# Patient Record
Sex: Male | Born: 1995 | Race: Asian | Hispanic: No | Marital: Single | Smoking: Never smoker
Health system: Southern US, Community
[De-identification: ages and names within clinical notes are randomized; demographics above are authoritative.]

---

## 2019-01-12 ENCOUNTER — Encounter (HOSPITAL_COMMUNITY): Payer: Self-pay

## 2019-01-12 ENCOUNTER — Ambulatory Visit (HOSPITAL_COMMUNITY)
Admission: EM | Admit: 2019-01-12 | Discharge: 2019-01-12 | Disposition: A | Discharge status: Home / Self Care | Payer: Self-pay | Attending: Urgent Care | Admitting: Urgent Care

## 2019-01-12 ENCOUNTER — Other Ambulatory Visit: Payer: Self-pay

## 2019-01-12 DIAGNOSIS — R140 Abdominal distension (gaseous): Secondary | ICD-10-CM

## 2019-01-12 DIAGNOSIS — R1084 Generalized abdominal pain: Secondary | ICD-10-CM

## 2019-01-12 MED ORDER — OMEPRAZOLE 20 MG PO CPDR: 20.0000 mg | DELAYED_RELEASE_CAPSULE | Freq: Every day | ORAL | 0 refills | Status: DC

## 2019-01-12 MED ORDER — FAMOTIDINE 20 MG PO TABS: 20.0000 mg | ORAL_TABLET | Freq: Two times a day (BID) | ORAL | 0 refills | Status: AC

## 2019-01-12 NOTE — ED Provider Notes (Signed)
  MRN: [id number] DOB: June 28, 1996  Subjective:   Kyle Huerta is a 23 y.o. male presenting for 3 day history of recurrent generalized mild-moderate abdominal pain/bloating. Pain is like a burning sensation. Has had 1 episode of vomiting. Sx are worse after eating. Has previously had trouble with gas and burping. Patient generally eats spicy foods but when he hurts like this has to stop eating spicy foods. Laying down also elicits his pain.  Patient has daily bowel movements without any bloody stools, straining or trouble with constipation.  He has no urinary symptoms.  Additional review of systems below.  Denies smoking cigarettes or drinking alcohol.  He is not currently taking any medications and has no known food or drug allergies.  Denies past medical and surgical history.  Review of Systems  Constitutional: Negative for fever and malaise/fatigue.  HENT: Negative for congestion, ear pain, sinus pain and sore throat.   Eyes: Negative for blurred vision, double vision, discharge and redness.  Respiratory: Negative for cough, hemoptysis, shortness of breath and wheezing.   Cardiovascular: Negative for chest pain.  Gastrointestinal: Positive for abdominal pain and vomiting. Negative for diarrhea and nausea.  Genitourinary: Negative for dysuria, flank pain and hematuria.  Musculoskeletal: Negative for myalgias.  Skin: Negative for rash.  Neurological: Negative for dizziness, weakness and headaches.  Psychiatric/Behavioral: Negative for depression and substance abuse.    Objective:   Vitals: BP 125/85 (BP Location: Right Arm)   Pulse 92   Temp 98.3 F (36.8 C) (Oral)   Resp 18   SpO2 96%   Physical Exam Constitutional:      Appearance: Normal appearance. He is well-developed and normal weight.  HENT:     Head: Normocephalic and atraumatic.     Right Ear: External ear normal.     Left Ear: External ear normal.     Nose: Nose normal.     Mouth/Throat:     Pharynx: Oropharynx is  clear.  Eyes:     General: No scleral icterus.    Extraocular Movements: Extraocular movements intact.     Pupils: Pupils are equal, round, and reactive to light.  Cardiovascular:     Rate and Rhythm: Normal rate and regular rhythm.     Heart sounds: No murmur. No friction rub. No gallop.   Pulmonary:     Effort: Pulmonary effort is normal. No respiratory distress.     Breath sounds: No wheezing or rales.  Abdominal:     General: Bowel sounds are normal. There is no distension.     Palpations: Abdomen is soft. There is no mass.     Tenderness: There is abdominal tenderness (Mild over mid abdomen). There is no guarding or rebound.  Skin:    General: Skin is warm and dry.  Neurological:     Mental Status: He is alert and oriented to person, place, and time.  Psychiatric:        Mood and Affect: Mood normal.        Behavior: Behavior normal.    Assessment and Plan :   1. Generalized abdominal pain   2. Abdominal bloating    Will manage for GERD with Pepcid and Prilosec.  Counseled patient on dietary modifications.  Generally his physical exam is really good and vital signs are stable.  Will place patient in a work you to establish care with a PCP for continued work-up including imaging and labs.   Jaynee Eagles, [place] 01/12/19 [phone]

## 2019-01-12 NOTE — ED Triage Notes (Signed)
Pt presents with generalized abdominal pain & burning that he states mostly happens after eating and at bedtime; pt also complains of back pain.

## 2019-01-15 ENCOUNTER — Emergency Department (HOSPITAL_COMMUNITY)
Admission: EM | Admit: 2019-01-15 | Discharge: 2019-01-15 | Disposition: A | Discharge status: Home / Self Care | Payer: Self-pay | Attending: Emergency Medicine | Admitting: Emergency Medicine

## 2019-01-15 ENCOUNTER — Other Ambulatory Visit: Payer: Self-pay

## 2019-01-15 ENCOUNTER — Emergency Department (HOSPITAL_COMMUNITY): Payer: Self-pay

## 2019-01-15 DIAGNOSIS — R1033 Periumbilical pain: Secondary | ICD-10-CM

## 2019-01-15 DIAGNOSIS — R1110 Vomiting, unspecified: Secondary | ICD-10-CM | POA: Insufficient documentation

## 2019-01-15 DIAGNOSIS — R1011 Right upper quadrant pain: Secondary | ICD-10-CM

## 2019-01-15 LAB — CBC WITH DIFFERENTIAL/PLATELET
Abs Immature Granulocytes: 0.04 10*3/uL (ref 0.00–0.07)
Basophils Absolute: 0.1 10*3/uL (ref 0.0–0.1)
Basophils Relative: 1 %
Eosinophils Absolute: 1.8 10*3/uL — ABNORMAL HIGH (ref 0.0–0.5)
Eosinophils Relative: 14 %
HCT: 42.8 % (ref 39.0–52.0)
Hemoglobin: 15.2 g/dL (ref 13.0–17.0)
Immature Granulocytes: 0 %
Lymphocytes Relative: 35 %
Lymphs Abs: 4.6 10*3/uL — ABNORMAL HIGH (ref 0.7–4.0)
MCH: 29.1 pg (ref 26.0–34.0)
MCHC: 35.5 g/dL (ref 30.0–36.0)
MCV: 81.8 fL (ref 80.0–100.0)
Monocytes Absolute: 0.6 10*3/uL (ref 0.1–1.0)
Monocytes Relative: 5 %
Neutro Abs: 5.9 10*3/uL (ref 1.7–7.7)
Neutrophils Relative %: 45 %
Platelets: 200 10*3/uL (ref 150–400)
RBC: 5.23 MIL/uL (ref 4.22–5.81)
RDW: 12.6 % (ref 11.5–15.5)
WBC: 13.1 10*3/uL — ABNORMAL HIGH (ref 4.0–10.5)
nRBC: 0 % (ref 0.0–0.2)

## 2019-01-15 LAB — COMPREHENSIVE METABOLIC PANEL
ALT: 156 U/L — ABNORMAL HIGH (ref 0–44)
AST: 76 U/L — ABNORMAL HIGH (ref 15–41)
Albumin: 4.3 g/dL (ref 3.5–5.0)
Alkaline Phosphatase: 88 U/L (ref 38–126)
Anion gap: 8 (ref 5–15)
BUN: 6 mg/dL (ref 6–20)
CO2: 24 mmol/L (ref 22–32)
Calcium: 9.3 mg/dL (ref 8.9–10.3)
Chloride: 105 mmol/L (ref 98–111)
Creatinine, Ser: 0.91 mg/dL (ref 0.61–1.24)
GFR calc Af Amer: >60 mL/min (ref >60)
GFR calc non Af Amer: >60 mL/min (ref >60)
Glucose, Bld: 102 mg/dL — ABNORMAL HIGH (ref 70–99)
Potassium: 3.8 mmol/L (ref 3.5–5.1)
Sodium: 137 mmol/L (ref 135–145)
Total Bilirubin: 1.1 mg/dL (ref 0.3–1.2)
Total Protein: 7.1 g/dL (ref 6.5–8.1)

## 2019-01-15 LAB — URINALYSIS, ROUTINE W REFLEX MICROSCOPIC
Bilirubin Urine: NEGATIVE
Glucose, UA: NEGATIVE mg/dL
Hgb urine dipstick: NEGATIVE
Ketones, ur: NEGATIVE mg/dL
Leukocytes,Ua: NEGATIVE
Nitrite: NEGATIVE
Protein, ur: NEGATIVE mg/dL
Specific Gravity, Urine: 1.02 (ref 1.005–1.030)
pH: 6 (ref 5.0–8.0)

## 2019-01-15 LAB — LIPASE, BLOOD: Lipase: 24 U/L (ref 11–51)

## 2019-01-15 LAB — ACETAMINOPHEN LEVEL: Acetaminophen (Tylenol), Serum: <10 ug/mL — ABNORMAL LOW (ref 10–30)

## 2019-01-15 MED ORDER — ONDANSETRON 4 MG PO TBDP
8.0000 mg | ORAL_TABLET | Freq: Once | ORAL | Status: AC
  Administered 2019-01-15: 09:00:00 8 mg via ORAL
  Filled 2019-01-15: qty 2

## 2019-01-15 MED ORDER — PANTOPRAZOLE SODIUM 40 MG PO TBEC: 40.0000 mg | DELAYED_RELEASE_TABLET | Freq: Every day | ORAL | 0 refills | Status: AC

## 2019-01-15 MED ORDER — ALUM & MAG HYDROXIDE-SIMETH 200-200-20 MG/5ML PO SUSP
30.0000 mL | Freq: Once | ORAL | Status: AC
  Administered 2019-01-15: 30 mL via ORAL
  Filled 2019-01-15: qty 30

## 2019-01-15 MED ORDER — PANTOPRAZOLE SODIUM 40 MG IV SOLR
40.0000 mg | Freq: Once | INTRAVENOUS | Status: AC
  Administered 2019-01-15: 40 mg via INTRAVENOUS
  Filled 2019-01-15: qty 40

## 2019-01-15 NOTE — ED Triage Notes (Signed)
Pt here for central abdominal burning. Seen for same on Thursday, has been taking rx with minimal relief. Endorses vomiting after eating.

## 2019-01-15 NOTE — ED Notes (Signed)
Patient transported to Ultrasound 

## 2019-01-15 NOTE — ED Provider Notes (Signed)
[facility] Provider Note   CSN: [id number] Arrival date & time: 01/15/19  [id number]     History   Chief Complaint Chief Complaint  Patient presents with  . Abdominal Pain    HPI Janifer Adieavin Galasso is a 23 y.o. male who presents with abdominal pain.  No significant past medical history.  Patient states that since Tuesday he has had constant periumbilical abdominal pain.  Pain feels like a burning sensation.  He states that whenever he tries to eat something - even water -he will vomit and the pain will worsen.  The pain also seems to be better in the morning and worse at night.  He went to urgent care and symptoms sounded consistent with gastritis/reflux and he was given omeprazole and Pepcid.  He states that this does help his pain for several hours however the pain becomes unbearable at night.  He denies fever, chills, chest pain, nausea, diarrhea or constipation.  He has not had any blood in the stool or vomit.  He has had similar symptoms like this before several months ago but it went away after he drinks warm salt water.  This episode is much worse.  He denies taking significant amount of NSAIDs or Tylenol.  He says he does not drink any alcohol.  He denies any urinary symptoms.  Denies prior abdominal surgeries. No recent travel - he states he's been in the country [place] 8 years.     HPI  No past medical history on file.  There are no active problems to display for this patient.   No past surgical history on file.      Home Medications    Prior to Admission medications   Medication Sig Start Date End Date Taking? Authorizing Provider  famotidine (PEPCID) 20 MG tablet Take 1 tablet (20 mg total) by mouth 2 (two) times daily. 01/12/19   Wallis BambergMani, Mario, PA-C  omeprazole (PRILOSEC) 20 MG capsule Take 1 capsule (20 mg total) by mouth daily. 01/12/19   Wallis BambergMani, Mario, PA-C    Family History Family History  Family history unknown: Yes    Social History  Social History   Tobacco Use  . Smoking status: Never Smoker  . Smokeless tobacco: Never Used  Substance Use Topics  . Alcohol use: Not on file  . Drug use: Not on file     Allergies   Patient has no known allergies.   Review of Systems Review of Systems  Constitutional: Negative for fever.  Cardiovascular: Negative for chest pain.  Gastrointestinal: Positive for abdominal pain and vomiting. Negative for blood in stool, constipation, diarrhea and nausea.  Genitourinary: Negative for difficulty urinating and dysuria.  All other systems reviewed and are negative.    Physical Exam Updated Vital Signs BP 132/86 (BP Location: Right Arm)   Pulse 62   Temp 98 F (36.7 C) (Oral)   Resp 16   Ht 5\' 4"  (1.626 m)   Wt 68 kg   SpO2 99%   BMI 25.75 kg/m   Physical Exam Vitals signs and nursing note reviewed.  Constitutional:      General: He is not in acute distress.    Appearance: Normal appearance. He is well-developed. He is not ill-appearing.     Comments: Calm, cooperative. Comfortable appearing. Speaks Nepali  HENT:     Head: Normocephalic and atraumatic.  Eyes:     General: No scleral icterus.       Right eye: No discharge.  Left eye: No discharge.     Conjunctiva/sclera: Conjunctivae normal.     Pupils: Pupils are equal, round, and reactive to light.  Neck:     Musculoskeletal: Normal range of motion.  Cardiovascular:     Rate and Rhythm: Normal rate and regular rhythm.  Pulmonary:     Effort: Pulmonary effort is normal. No respiratory distress.     Breath sounds: Normal breath sounds. No stridor.  Abdominal:     General: Abdomen is flat. There is no distension.     Palpations: Abdomen is soft.     Tenderness: There is abdominal tenderness (mild periumbilical).  Skin:    General: Skin is warm and dry.  Neurological:     Mental Status: He is alert and oriented to person, place, and time.  Psychiatric:        Mood and Affect: Mood normal.         Behavior: Behavior normal.      ED Treatments / Results  Labs (all labs ordered are listed, but only abnormal results are displayed) Labs Reviewed  COMPREHENSIVE METABOLIC PANEL - Abnormal; Notable for the following components:      Result Value   Glucose, Bld 102 (*)    AST 76 (*)    ALT 156 (*)    All other components within normal limits  CBC WITH DIFFERENTIAL/PLATELET - Abnormal; Notable for the following components:   WBC 13.1 (*)    Lymphs Abs 4.6 (*)    Eosinophils Absolute 1.8 (*)    All other components within normal limits  ACETAMINOPHEN LEVEL - Abnormal; Notable for the following components:   Acetaminophen (Tylenol), Serum <10 (*)    All other components within normal limits  LIPASE, BLOOD  URINALYSIS, ROUTINE W REFLEX MICROSCOPIC  HEPATITIS PANEL, ACUTE    EKG None  Radiology No results found.  Procedures Procedures (including critical care time)  Medications Ordered in ED Medications - No data to display   Initial Impression / Assessment and Plan / ED Course  I have reviewed the triage vital signs and the nursing notes.  Pertinent labs & imaging results that were available during my care of the patient were reviewed by me and considered in my medical decision making (see chart for details).  23 year old male presents with periumbilical abdominal pain for several days it is a burning sensation.  Is worse after eating.  He is taking omeprazole and Pepcid and it does make his symptoms better temporarily but he is worried because symptoms are not going away.  His vitals are normal.  Heart is regular rate and rhythm, lungs are clear to auscultation, abdomen is soft and minimally tender in the periumbilical area.  Will obtain labs and give medication for gastritis/reflux. I do not think he needs imaging at this time.   10:25 AM CBC is remarkable for leukocytosis of 13.1 with mild elevation of lymphocytes and eosinophils. CMP is remarkable for mild elevation of  LFTs. AST is 76 and ALT is 156. AP and bili are normal. UA is normal. Will order RUQ [place] to further assess. DDX biliary pathology vs hepatitis.   Ultrasound is remarkable for possible fatty liver disease. Unclear if this is related to his elevated liver enzymes or not.  On recheck the patient he states that the pain is somewhat better after medication.  He tolerated p.o. here.  Tylenol levels are normal.  I advised him to follow-up with GI.  Will change his PPI to Protonix and advised to  continue Pepcid.  Return precautions given.   Final Clinical Impressions(s) / ED Diagnoses   Final diagnoses:  RUQ pain  Periumbilical abdominal pain    ED Discharge Orders    None       Bethel BornGekas, Livan Hires Marie, PA-C 01/15/19 1228    Arby BarrettePfeiffer, Marcy, MD 01/25/19 1231

## 2019-01-15 NOTE — ED Notes (Signed)
Used language iPad to speak and discharge patient.

## 2019-01-15 NOTE — ED Notes (Signed)
Patient transported back from Ultrasound 

## 2019-01-15 NOTE — Discharge Instructions (Addendum)
Please follow up with Gastroenterology (stomach doctor) Take Protonix 40mg  every day Continue Famotidine 20mg  twice a day Avoid spicy foods, fatty foods, and NSAIDs (Ibuprofen, Advil, Aspirin, BC powder, Naproxen, Aleve, etc) Return if worsening

## 2019-01-16 LAB — HEPATITIS PANEL, ACUTE
HCV Ab: <0.1 s/co ratio (ref 0.0–0.9)
Hep A IgM: NEGATIVE
Hep B C IgM: NEGATIVE
Hepatitis B Surface Ag: NEGATIVE

## 2019-01-17 ENCOUNTER — Encounter (HOSPITAL_COMMUNITY): Payer: Self-pay

## 2019-01-17 ENCOUNTER — Emergency Department (HOSPITAL_COMMUNITY): Payer: Self-pay

## 2019-01-17 ENCOUNTER — Other Ambulatory Visit: Payer: Self-pay

## 2019-01-17 ENCOUNTER — Emergency Department (HOSPITAL_COMMUNITY)
Admission: EM | Admit: 2019-01-17 | Discharge: 2019-01-17 | Disposition: A | Discharge status: Home / Self Care | Payer: Self-pay | Attending: Emergency Medicine | Admitting: Emergency Medicine

## 2019-01-17 DIAGNOSIS — R109 Unspecified abdominal pain: Secondary | ICD-10-CM | POA: Insufficient documentation

## 2019-01-17 DIAGNOSIS — Z79899 Other long term (current) drug therapy: Secondary | ICD-10-CM | POA: Insufficient documentation

## 2019-01-17 LAB — COMPREHENSIVE METABOLIC PANEL
ALT: 141 U/L — ABNORMAL HIGH (ref 0–44)
AST: 74 U/L — ABNORMAL HIGH (ref 15–41)
Albumin: 4.2 g/dL (ref 3.5–5.0)
Alkaline Phosphatase: 84 U/L (ref 38–126)
Anion gap: 11 (ref 5–15)
BUN: 8 mg/dL (ref 6–20)
CO2: 22 mmol/L (ref 22–32)
Calcium: 9.2 mg/dL (ref 8.9–10.3)
Chloride: 107 mmol/L (ref 98–111)
Creatinine, Ser: 0.91 mg/dL (ref 0.61–1.24)
GFR calc Af Amer: >60 mL/min (ref >60)
GFR calc non Af Amer: >60 mL/min (ref >60)
Glucose, Bld: 151 mg/dL — ABNORMAL HIGH (ref 70–99)
Potassium: 3.8 mmol/L (ref 3.5–5.1)
Sodium: 140 mmol/L (ref 135–145)
Total Bilirubin: 0.8 mg/dL (ref 0.3–1.2)
Total Protein: 6.9 g/dL (ref 6.5–8.1)

## 2019-01-17 LAB — URINALYSIS, ROUTINE W REFLEX MICROSCOPIC
Bilirubin Urine: NEGATIVE
Glucose, UA: NEGATIVE mg/dL
Hgb urine dipstick: NEGATIVE
Ketones, ur: NEGATIVE mg/dL
Leukocytes,Ua: NEGATIVE
Nitrite: NEGATIVE
Protein, ur: NEGATIVE mg/dL
Specific Gravity, Urine: 1.019 (ref 1.005–1.030)
pH: 7 (ref 5.0–8.0)

## 2019-01-17 LAB — CBC
HCT: 42.5 % (ref 39.0–52.0)
Hemoglobin: 14.7 g/dL (ref 13.0–17.0)
MCH: 28.7 pg (ref 26.0–34.0)
MCHC: 34.6 g/dL (ref 30.0–36.0)
MCV: 83 fL (ref 80.0–100.0)
Platelets: 220 10*3/uL (ref 150–400)
RBC: 5.12 MIL/uL (ref 4.22–5.81)
RDW: 12.6 % (ref 11.5–15.5)
WBC: 13.9 10*3/uL — ABNORMAL HIGH (ref 4.0–10.5)
nRBC: 0 % (ref 0.0–0.2)

## 2019-01-17 LAB — LIPASE, BLOOD: Lipase: 27 U/L (ref 11–51)

## 2019-01-17 MED ORDER — SODIUM CHLORIDE 0.9% FLUSH: 3.0000 mL | Freq: Once | INTRAVENOUS | Status: DC

## 2019-01-17 MED ORDER — IOHEXOL 300 MG/ML  SOLN
100.0000 mL | Freq: Once | INTRAMUSCULAR | Status: AC | PRN
  Administered 2019-01-17: 16:00:00 100 mL via INTRAVENOUS

## 2019-01-17 MED ORDER — SUCRALFATE 1 G PO TABS: 1.0000 g | ORAL_TABLET | Freq: Three times a day (TID) | ORAL | 0 refills | Status: AC

## 2019-01-17 NOTE — ED Triage Notes (Signed)
Pt reports continued abd pain for the past week, pt seen at [facility] and was told it was GERD, pt then came here on Sunday and had an US done with no acute findings. Pt rates pain 9/10 and states it sometimes radiates to his back.  nad noted at this time.

## 2019-01-17 NOTE — ED Provider Notes (Addendum)
[facility] EMERGENCY DEPARTMENT Provider Note   CSN: [id number] Arrival date & time: 01/17/19  1253    History   Chief Complaint Chief Complaint  Patient presents with  . Abdominal Pain    HPI Kyle Huerta is a 23 y.o. male.     HPI Pt states he has been having pain in his abdomen for 1 week.  The pt has been seen a couple of times for this now.  He has been given medications without much relief.  THe pain is in the mid abdomen and goes to his back.   No fever.  He has been vomiting 4-5 times since the pain started.  He has not vomited today.  It gets worse when he eats. Sometimes the pain feels like a burning but not always.  He was referred to [facility] but the firest appointment is not until august.  The pain was worse so he came back to the ED.  He was seen in the ED two days ago for this same complaint. History reviewed. No pertinent past medical history.  There are no active problems to display for this patient.   History reviewed. No pertinent surgical history.      Home Medications    Prior to Admission medications   Medication Sig Start Date End Date Taking? Authorizing Provider  famotidine (PEPCID) 20 MG tablet Take 1 tablet (20 mg total) by mouth 2 (two) times daily. 01/12/19  Yes Jaynee Eagles, PA-C  pantoprazole (PROTONIX) 40 MG tablet Take 1 tablet (40 mg total) by mouth daily. 01/15/19  Yes Recardo Evangelist, PA-C  sucralfate (CARAFATE) 1 g tablet Take 1 tablet (1 g total) by mouth 4 (four) times daily -  with meals and at bedtime. 01/17/19   Dorie Rank, MD  omeprazole (PRILOSEC) 20 MG capsule Take 1 capsule (20 mg total) by mouth daily. Patient not taking: Reported on 01/15/2019 01/12/19 01/15/19  Jaynee Eagles, PA-C    Family History Family History  Family history unknown: Yes    Social History Social History   Tobacco Use  . Smoking status: Never Smoker  . Smokeless tobacco: Never Used  Substance Use Topics  . Alcohol use: Not on file   . Drug use: Not on file     Allergies   Patient has no known allergies.   Review of Systems Review of Systems   Physical Exam Updated Vital Signs BP 136/85 (BP Location: Right Arm)   Pulse 79   Temp 98.4 F (36.9 C) (Oral)   Resp 16   Ht 1.626 m (5\' 4" )   Wt 68 kg   SpO2 97%   BMI 25.75 kg/m   Physical Exam Vitals signs and nursing note reviewed.  Constitutional:      General: He is not in acute distress.    Appearance: He is well-developed.  HENT:     Head: Normocephalic and atraumatic.     Right Ear: External ear normal.     Left Ear: External ear normal.  Eyes:     General: No scleral icterus.       Right eye: No discharge.        Left eye: No discharge.     Conjunctiva/sclera: Conjunctivae normal.  Neck:     Musculoskeletal: Neck supple.     Trachea: No tracheal deviation.  Cardiovascular:     Rate and Rhythm: Normal rate and regular rhythm.  Pulmonary:     Effort: Pulmonary effort is normal. No respiratory  distress.     Breath sounds: Normal breath sounds. No stridor. No wheezing or rales.  Abdominal:     General: Bowel sounds are normal. There is no distension.     Palpations: Abdomen is soft.     Tenderness: There is generalized abdominal tenderness. There is no guarding or rebound.  Musculoskeletal:        General: No tenderness.  Skin:    General: Skin is warm and dry.     Findings: No rash.  Neurological:     Mental Status: He is alert.     Cranial Nerves: No cranial nerve deficit (no facial droop, extraocular movements intact, no slurred speech).     Sensory: No sensory deficit.     Motor: No abnormal muscle tone or seizure activity.     Coordination: Coordination normal.      ED Treatments / Results  Labs (all labs ordered are listed, but only abnormal results are displayed) Labs Reviewed  COMPREHENSIVE METABOLIC PANEL - Abnormal; Notable for the following components:      Result Value   Glucose, Bld 151 (*)    AST 74 (*)    ALT  141 (*)    All other components within normal limits  CBC - Abnormal; Notable for the following components:   WBC 13.9 (*)    All other components within normal limits  LIPASE, BLOOD  URINALYSIS, ROUTINE W REFLEX MICROSCOPIC    EKG None  Radiology Ct Abdomen Pelvis W Contrast  Result Date: 01/17/2019 CLINICAL DATA:  Lower abdomen pain for 1 week. EXAM: CT ABDOMEN AND PELVIS WITH CONTRAST TECHNIQUE: Multidetector CT imaging of the abdomen and pelvis was performed using the standard protocol following bolus administration of intravenous contrast. CONTRAST:  100mL OMNIPAQUE IOHEXOL 300 MG/ML  SOLN COMPARISON:  Right upper quadrant ultrasound January 15, 2019 FINDINGS: Lower chest: No acute abnormality. Hepatobiliary: Diffuse low density of the liver is identified without focal liver lesion. The gallbladder is normal. The biliary tree is normal. Pancreas: Unremarkable. No pancreatic ductal dilatation or surrounding inflammatory changes. Spleen: Normal in size without focal abnormality. Adrenals/Urinary Tract: Adrenal glands are unremarkable. Kidneys are normal, without renal calculi, focal lesion, or hydronephrosis. Bladder is unremarkable. Stomach/Bowel: Stomach is within normal limits. The appendix is normal. No evidence of bowel wall thickening, distention, or inflammatory changes. Vascular/Lymphatic: No significant vascular findings are present. No enlarged abdominal or pelvic lymph nodes. Reproductive: Prostate is unremarkable. Other: None Musculoskeletal: No acute abnormality IMPRESSION: No acute abnormality identified in the abdomen and pelvis. No bowel obstruction is noted. Normal appendix. Fatty infiltration of liver. Electronically Signed   By: Sherian ReinWei-Chen  Lin M.D.   On: 01/17/2019 16:00    Procedures Procedures (including critical care time)  Medications Ordered in ED Medications  sodium chloride flush (NS) 0.9 % injection 3 mL (has no administration in time range)  iohexol (OMNIPAQUE) 300  MG/ML solution 100 mL (100 mLs Intravenous Contrast Given 01/17/19 1547)     Initial Impression / Assessment and Plan / ED Course  I have reviewed the triage vital signs and the nursing notes.  Pertinent labs & imaging results that were available during my care of the patient were reviewed by me and considered in my medical decision making (see chart for details).   Patient return to the ED for worsening abdominal pain.  Patient's laboratory tests show persistent elevation of LFTs these are only mildly elevated.  He had a hepatitis panel the other day and that is negative.  The  CT scan today does not show any abnormalities to account for his symptoms.  They be having issues with an ulcer or gastritis.  Patient is already taking antacids.  I will have him add on Carafate.  He appears stable for outpatient follow-up with gastroenterology as planned.  Final Clinical Impressions(s) / ED Diagnoses   Final diagnoses:  Abdominal pain, unspecified abdominal location    ED Discharge Orders         Ordered    sucralfate (CARAFATE) 1 g tablet  3 times daily with meals & bedtime     01/17/19 1722           Linwood DibblesKnapp, Gatlyn Lipari, MD 01/17/19 1724    Linwood DibblesKnapp, Alayzha An, MD 02/03/19 [phone]

## 2019-01-17 NOTE — Discharge Instructions (Addendum)
Continue the previous medications.  Start taking the Carafate.  Follow-up with the GI doctors as planned.

## 2019-01-18 ENCOUNTER — Ambulatory Visit (INDEPENDENT_AMBULATORY_CARE_PROVIDER_SITE_OTHER): Payer: Self-pay | Admitting: Gastroenterology

## 2019-01-18 ENCOUNTER — Encounter: Payer: Self-pay | Admitting: Gastroenterology

## 2019-01-18 DIAGNOSIS — R1013 Epigastric pain: Secondary | ICD-10-CM

## 2019-01-18 NOTE — Progress Notes (Signed)
This service was provided via virtual visit.  Both audio and visual were used.  The patient was located at home.  I was located in my office.  The patient did consent to this virtual visit and is aware of possible charges through their insurance for this visit.  The patient is a new patient.  My certified medical assistant, Grace Bushy, contributed to this visit by contacting the patient by phone 1 or 2 business days prior to the appointment and also followed up on the recommendations I made after the visit.  Time spent on virtual visit: 25 minutes   HPI: This is a very pleasant 23 year old man who was born in [place].  He speaks English very well and I believe we communicated quite well via this telemedicine visit.  Periumbilical pains and epigastric pains that started last week..  In the AM especially.  Night time it is bad again.  The pain is sometimes worse after he eats. + nausea, vomiting after he eats.  Pain radiates to his back.  He has been taking protonix daily.  Carafate, pepcid.  The meds seem to help a bit but certainly not completely  No bowel issues.  No NSAIDs.  No vits  Never had lft issues that he knows of.  He thinks he's lost 2 pounds  Right upper quadrant abdominal ultrasound July 2020 showed diffuse echogenic liver.  No focal liver lesions.  Otherwise normal.  CT scan abdomen pelvis with IV and oral contrast July 2020 was essentially normal as well.  Somewhat fatty appearing liver.  Lab testing July 2020 shows white count of 13.9, otherwise normal CBC.  Normal lipase, AST 74, ALT 141, but remaining complete metabolic profile was normal.  acute viral hepatitis panelwas negative.  Hepatitis B surface antigen negative, hepatitis C antibody negative, hepatitis A IgM negative, hepatitis B core IgM negative, Tylenol level was less than 10, UA negative  Chief complaint is abdominal pain, abnormal liver tests  ROS: complete GI ROS as described in HPI, all other review  negative.  Constitutional:  No unintentional weight loss   History reviewed. No pertinent past medical history.  History reviewed. No pertinent surgical history.  Current Outpatient Medications  Medication Sig Dispense Refill  . famotidine (PEPCID) 20 MG tablet Take 1 tablet (20 mg total) by mouth 2 (two) times daily. 60 tablet 0  . pantoprazole (PROTONIX) 40 MG tablet Take 1 tablet (40 mg total) by mouth daily. 30 tablet 0  . sucralfate (CARAFATE) 1 g tablet Take 1 tablet (1 g total) by mouth 4 (four) times daily -  with meals and at bedtime. 21 tablet 0   No current facility-administered medications for this visit.     Allergies as of 01/18/2019  . (No Known Allergies)    Family History  Family history unknown: Yes    Social History   Socioeconomic History  . Marital status: Single    Spouse name: Not on file  . Number of children: Not on file  . Years of education: Not on file  . Highest education level: Not on file  Occupational History  . Not on file  Social Needs  . Financial resource strain: Not on file  . Food insecurity    Worry: Not on file    Inability: Not on file  . Transportation needs    Medical: Not on file    Non-medical: Not on file  Tobacco Use  . Smoking status: Never Smoker  . Smokeless tobacco: Never  Used  Substance and Sexual Activity  . Alcohol use: Not on file  . Drug use: Not on file  . Sexual activity: Not on file  Lifestyle  . Physical activity    Days per week: Not on file    Minutes per session: Not on file  . Stress: Not on file  Relationships  . Social Musicianconnections    Talks on phone: Not on file    Gets together: Not on file    Attends religious service: Not on file    Active member of club or organization: Not on file    Attends meetings of clubs or organizations: Not on file    Relationship status: Not on file  . Intimate partner violence    Fear of current or ex partner: Not on file    Emotionally abused: Not on file     Physically abused: Not on file    Forced sexual activity: Not on file  Other Topics Concern  . Not on file  Social History Narrative  . Not on file     Physical Exam: Unable to perform because this was a "telemed visit" due to current Covid-19 pandemic  Assessment and plan: 23 y.o. male with abdominal pain, abnormal liver tests  Ultrasound and CT scan showed normal gallbladder without gallstones.  Certainly his pains are similar to biliary colic especially the fact that it radiates to his back.  He has been to the ER 3 times in the past couple weeks for these pains.  Besides slightly elevated transaminases his work-up has been essentially negative.  He feels a bit better on proton pump inhibitor, Carafate and Pepcid.  I recommended he continue that and I also recommended that we proceed with an upper endoscopy at his soonest convenience, I am hoping he can do it this Friday.  At the same time that he is here for the procedure we will have labs drawn to check for causes of his elevated liver tests.  C those labs listed below in the AVS.  Please see the "Patient Instructions" section for addition details about the plan.  Rob Buntinganiel Maybelline Kolarik, MD [facility] 01/18/2019, 10:46 AM

## 2019-01-18 NOTE — Patient Instructions (Addendum)
We will arrange for upper endoscopy this Friday morning for epigastric, periumbilical pain  He will stay on his Protonix, Pepcid and Carafate until then.  I would also like him to drop by the lab Friday morning when he comes to the building for his procedure to test him for :  Hepatitis A (total Ab) , Hepatitis B surface antibody, total iron, ferritin, TIBC, ANA, AMA, anti smooth muscle antibody, alpha 1 antitrypsin, cerulloplasm, TTG, total IgA level.  Patient will come in at 8:15am on Friday before EGD for labs  Thank you for entrusting me with your care and choosing [facility].  Dr Ardis Hughs

## 2019-01-19 ENCOUNTER — Telehealth: Payer: Self-pay | Admitting: Gastroenterology

## 2019-01-19 NOTE — Telephone Encounter (Signed)
Left message to call back to ask Covid-19 screening questions. °Covid-19 Screening Questions: ° °Do you now or have you had a fever in the last 14 days? no ° °Do you have any respiratory symptoms of shortness of breath or cough now or in the last 14 days? no ° °Do you have any family members or close contacts with diagnosed or suspected Covid-19 in the past 14 days? no ° °Have you been tested for Covid-19 and found to be positive? no ° °Pt made aware of that care partner may wait in the car or come up to the lobby during the procedure but will need to provide their own mask. °

## 2019-01-20 ENCOUNTER — Ambulatory Visit (AMBULATORY_SURGERY_CENTER): Payer: Self-pay | Admitting: Gastroenterology

## 2019-01-20 ENCOUNTER — Other Ambulatory Visit (INDEPENDENT_AMBULATORY_CARE_PROVIDER_SITE_OTHER): Payer: Self-pay

## 2019-01-20 ENCOUNTER — Other Ambulatory Visit: Payer: Self-pay

## 2019-01-20 ENCOUNTER — Encounter: Payer: Self-pay | Admitting: Gastroenterology

## 2019-01-20 VITALS — BP 128/89 | HR 67 | Temp 98.6°F | Resp 20 | Ht 64.0 in | Wt 156.0 lb

## 2019-01-20 DIAGNOSIS — K2950 Unspecified chronic gastritis without bleeding: Secondary | ICD-10-CM

## 2019-01-20 DIAGNOSIS — R1013 Epigastric pain: Secondary | ICD-10-CM

## 2019-01-20 DIAGNOSIS — K2970 Gastritis, unspecified, without bleeding: Secondary | ICD-10-CM

## 2019-01-20 LAB — IBC + FERRITIN
Ferritin: 196.2 ng/mL (ref 22.0–322.0)
Iron: 92 ug/dL (ref 42–165)
Saturation Ratios: 22.9 % (ref 20.0–50.0)
Transferrin: 287 mg/dL (ref 212.0–360.0)

## 2019-01-20 LAB — IRON: Iron: 92 ug/dL (ref 42–165)

## 2019-01-20 LAB — IGA: IgA: 152 mg/dL (ref 68–378)

## 2019-01-20 MED ORDER — SODIUM CHLORIDE 0.9 % IV SOLN: 500.0000 mL | Freq: Once | INTRAVENOUS | Status: DC

## 2019-01-20 NOTE — Patient Instructions (Signed)
Discharge instructions given. Biopsies taken. Resume previous medications. YOU HAD AN ENDOSCOPIC PROCEDURE TODAY AT THE [facility]:   Refer to the procedure report that was given to you for any specific questions about what was found during the examination.  If the procedure report does not answer your questions, please call your gastroenterologist to clarify.  If you requested that your care partner not be given the details of your procedure findings, then the procedure report has been included in a sealed envelope for you to review at your convenience later.  YOU SHOULD EXPECT: Some feelings of bloating in the abdomen. Passage of more gas than usual.  Walking can help get rid of the air that was put into your GI tract during the procedure and reduce the bloating. If you had a lower endoscopy (such as a colonoscopy or flexible sigmoidoscopy) you may notice spotting of blood in your stool or on the toilet paper. If you underwent a bowel prep for your procedure, you may not have a normal bowel movement for a few days.  Please Note:  You might notice some irritation and congestion in your nose or some drainage.  This is from the oxygen used during your procedure.  There is no need for concern and it should clear up in a day or so.  SYMPTOMS TO REPORT IMMEDIATELY:   Following upper endoscopy (EGD)  Vomiting of blood or coffee ground material  New chest pain or pain under the shoulder blades  Painful or persistently difficult swallowing  New shortness of breath  Fever of 100F or higher  Black, tarry-looking stools  For urgent or emergent issues, a gastroenterologist can be reached at any hour by calling [phone].   DIET:  We do recommend a small meal at first, but then you may proceed to your regular diet.  Drink plenty of fluids but you should avoid alcoholic beverages for 24 hours.  ACTIVITY:  You should plan to take it easy for the rest of today and you should NOT DRIVE  or use heavy machinery until tomorrow (because of the sedation medicines used during the test).    FOLLOW UP: Our staff will call the number listed on your records 48-72 hours following your procedure to check on you and address any questions or concerns that you may have regarding the information given to you following your procedure. If we do not reach you, we will leave a message.  We will attempt to reach you two times.  During this call, we will ask if you have developed any symptoms of COVID 19. If you develop any symptoms (ie: fever, flu-like symptoms, shortness of breath, cough etc.) before then, please call [phone].  If you test positive for Covid 19 in the 2 weeks post procedure, please call and report this information to us.    If any biopsies were taken you will be contacted by phone or by letter within the next 1-3 weeks.  Please call us at [phone] if you have not heard about the biopsies in 3 weeks.    SIGNATURES/CONFIDENTIALITY: You and/or your care partner have signed paperwork which will be entered into your electronic medical record.  These signatures attest to the fact that that the information above on your After Visit Summary has been reviewed and is understood.  Full responsibility of the confidentiality of this discharge information lies with you and/or your care-partner. 

## 2019-01-20 NOTE — Progress Notes (Signed)
History reviewed 

## 2019-01-20 NOTE — Progress Notes (Signed)
PT taken to PACU. Monitors in place. VSS. Report given to RN. 

## 2019-01-20 NOTE — Op Note (Signed)
[facility] Patient Name: Kyle Huerta Procedure Date: 01/20/2019 9:11 AM MRN: [id number] Endoscopist: Rachael Feeaniel P Taron Conrey , MD Age: 23 Referring MD:  Date of Birth: 05/03/1996 Gender: Male Account #: [id number] Procedure:                Upper GI endoscopy Indications:              Generalized abdominal pain, elevated liver tests Medicines:                Monitored Anesthesia Care Procedure:                Pre-Anesthesia Assessment:                           - Prior to the procedure, a History and Physical                            was performed, and patient medications and                            allergies were reviewed. The patient's tolerance of                            previous anesthesia was also reviewed. The risks                            and benefits of the procedure and the sedation                            options and risks were discussed with the patient.                            All questions were answered, and informed consent                            was obtained. Prior Anticoagulants: The patient has                            taken no previous anticoagulant or antiplatelet                            agents. ASA Grade Assessment: II - A patient with                            mild systemic disease. After reviewing the risks                            and benefits, the patient was deemed in                            satisfactory condition to undergo the procedure.                           After obtaining informed consent, the endoscope was  passed under direct vision. Throughout the                            procedure, the patient's blood pressure, pulse, and                            oxygen saturations were monitored continuously. The                            Endoscope was introduced through the mouth, and                            advanced to the second part of duodenum. The upper                            GI  endoscopy was accomplished without difficulty.                            The patient tolerated the procedure well. Scope In: Scope Out: Findings:                 Mild inflammation characterized by erythema,                            friability and granularity was found in the entire                            examined stomach. Biopsies were taken with a cold                            forceps for histology. Slight 'snakeskin'                            appearance to the mucosa in the proximal stomach.                           The exam was otherwise without abnormality. Complications:            No immediate complications. Estimated blood loss:                            None. Estimated Blood Loss:     Estimated blood loss: none. Impression:               - Non-specific pangastritis. Biopsied.                           - The examination was otherwise normal. Recommendation:           - Patient has a contact number available for                            emergencies. The signs and symptoms of potential                            delayed complications were discussed with the  patient. Return to normal activities tomorrow.                            Written discharge instructions were provided to the                            patient.                           - Resume previous diet.                           - Continue present medications.                           - Await pathology results and also the results of                            your additional liver testing this morning. Milus Banister, MD 01/20/2019 9:29:47 AM This report has been signed electronically.

## 2019-01-24 ENCOUNTER — Telehealth: Payer: Self-pay

## 2019-01-24 NOTE — Telephone Encounter (Signed)
  Follow up Call-  Call back number 01/20/2019  Post procedure Call Back phone  # [phone]  Permission to leave phone message Yes     Patient questions:  Do you have a fever, pain , or abdominal swelling? No. Pain Score  0 *  Have you tolerated food without any problems? Yes.  Have you been able to return to your normal activities? Yes.    Do you have any questions about your discharge instructions: Diet   No. Medications  No. Follow up visit  No.  Do you have questions or concerns about your Care? No.  Actions: * If pain score is 4 or above: No action needed, pain <4.  1. Have you developed a fever since your procedure? no  2.   Have you had an respiratory symptoms (SOB or cough) since your procedure? no  3.   Have you tested positive for COVID 19 since your procedure no  4.   Have you had any family members/close contacts diagnosed with the COVID 19 since your procedure?  no   If yes to any of these questions please route to Joylene John, RN and Alphonsa Gin, Therapist, sports.

## 2019-01-25 LAB — HEPATITIS B SURFACE ANTIBODY,QUALITATIVE: Hep B S Ab: REACTIVE — AB

## 2019-01-25 LAB — ANA: Anti Nuclear Antibody (ANA): NEGATIVE

## 2019-01-25 LAB — HEPATITIS A ANTIBODY, TOTAL: Hepatitis A AB,Total: REACTIVE — AB

## 2019-01-25 LAB — ANTI-SMOOTH MUSCLE ANTIBODY, IGG: Actin (Smooth Muscle) Antibody (IGG): <20 U (ref <20)

## 2019-01-25 LAB — TISSUE TRANSGLUTAMINASE, IGA: (tTG) Ab, IgA: 1 U/mL

## 2019-01-25 LAB — MITOCHONDRIAL ANTIBODIES: Mitochondrial M2 Ab, IgG: <=20 U

## 2019-01-25 LAB — CERULOPLASMIN: Ceruloplasmin: 26 mg/dL (ref 18–36)

## 2019-01-25 LAB — ALPHA-1-ANTITRYPSIN: A-1 Antitrypsin, Ser: 132 mg/dL (ref 83–199)

## 2019-01-27 ENCOUNTER — Other Ambulatory Visit: Payer: Self-pay

## 2019-01-27 DIAGNOSIS — Z20822 Contact with and (suspected) exposure to covid-19: Secondary | ICD-10-CM

## 2019-01-30 ENCOUNTER — Other Ambulatory Visit: Payer: Self-pay

## 2019-01-30 DIAGNOSIS — Z20822 Contact with and (suspected) exposure to covid-19: Secondary | ICD-10-CM

## 2019-01-30 DIAGNOSIS — R1013 Epigastric pain: Secondary | ICD-10-CM

## 2019-01-31 LAB — NOVEL CORONAVIRUS, NAA: SARS-CoV-2, NAA: DETECTED — AB

## 2019-02-01 ENCOUNTER — Other Ambulatory Visit: Payer: Self-pay

## 2019-02-01 DIAGNOSIS — Z20822 Contact with and (suspected) exposure to covid-19: Secondary | ICD-10-CM

## 2019-02-01 LAB — NOVEL CORONAVIRUS, NAA: SARS-CoV-2, NAA: NOT DETECTED

## 2019-02-03 LAB — NOVEL CORONAVIRUS, NAA: SARS-CoV-2, NAA: NOT DETECTED

## 2020-11-01 IMAGING — CT CT ABDOMEN AND PELVIS WITH CONTRAST
2 of 4 series · 17 of 46 positions shown, 19 images · IV contrast (APPLIED)
Comparison: Right upper quadrant ultrasound January 15, 2019

CLINICAL DATA: Lower abdomen pain for 1 week.

EXAM:
CT ABDOMEN AND PELVIS WITH CONTRAST
TECHNIQUE: Multidetector CT imaging of the abdomen and pelvis was performed
using the standard protocol following bolus administration of
intravenous contrast.
CONTRAST:  100mL OMNIPAQUE IOHEXOL 300 MG/ML  SOLN

[Series 3: abd/ pelvis 5.0 i30f 2 · axial · 0.71mm/px · z∈[+658,+1052]mm · 14 of 87 slices shown, 16 images]
[im 4/87  soft-tissue]
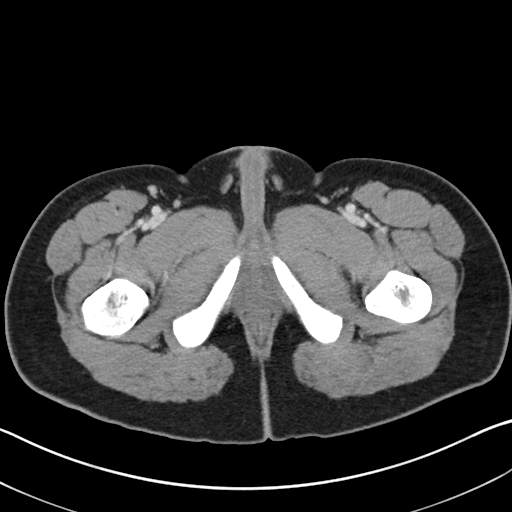
[im 4/87  bone]
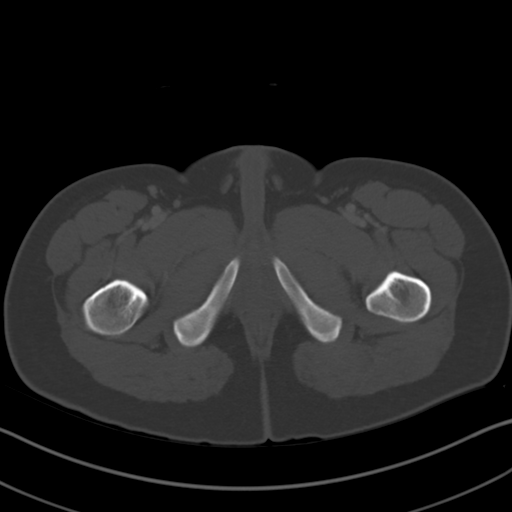
[im 10/87  soft-tissue]
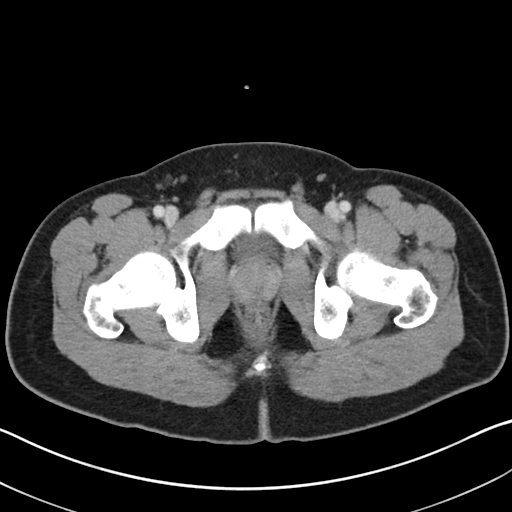
[im 17/87  soft-tissue]
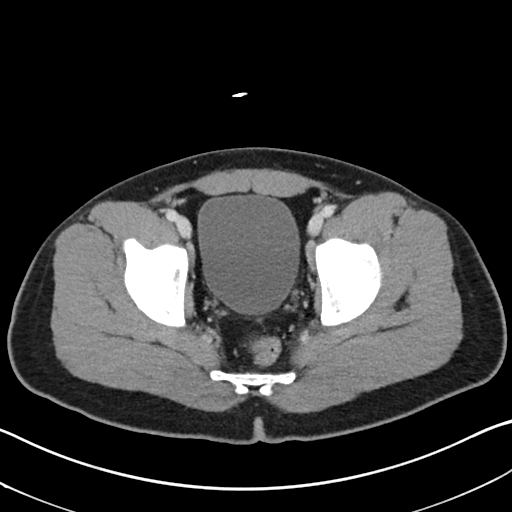
[im 24/87  soft-tissue]
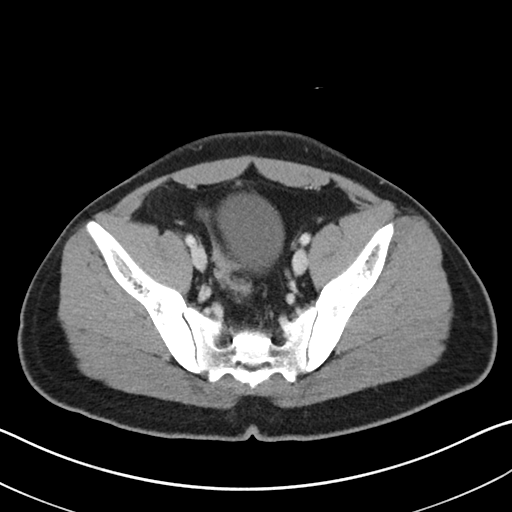
[im 30/87  soft-tissue]
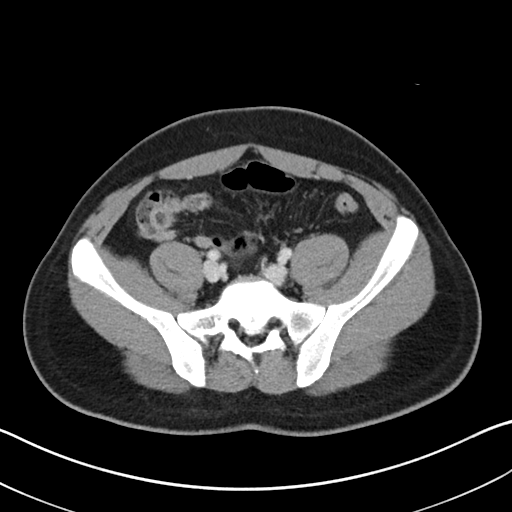
[im 34/87  soft-tissue]
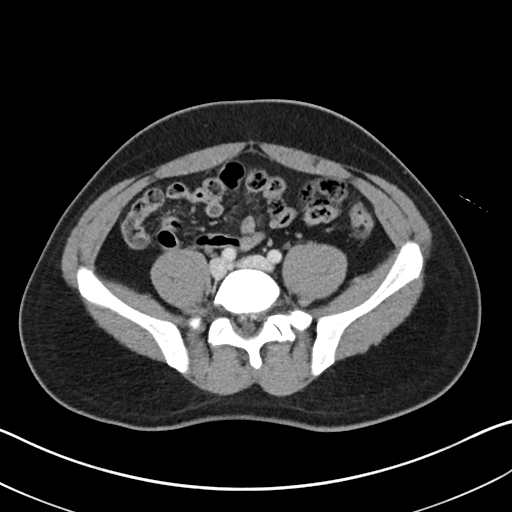
[im 40/87  soft-tissue]
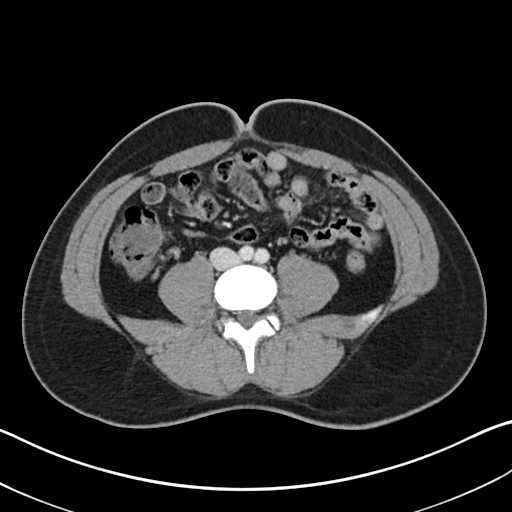
[im 47/87  soft-tissue]
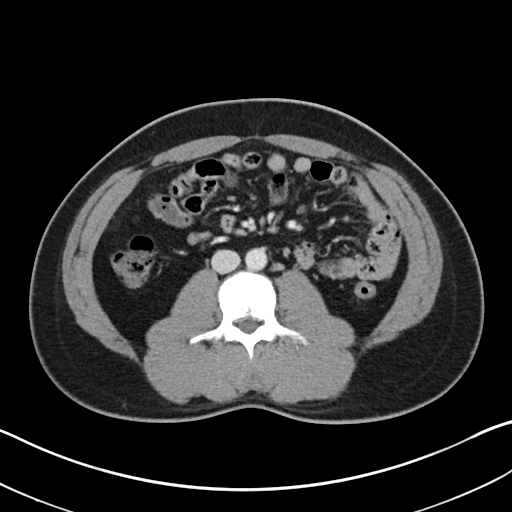
[im 53/87  soft-tissue]
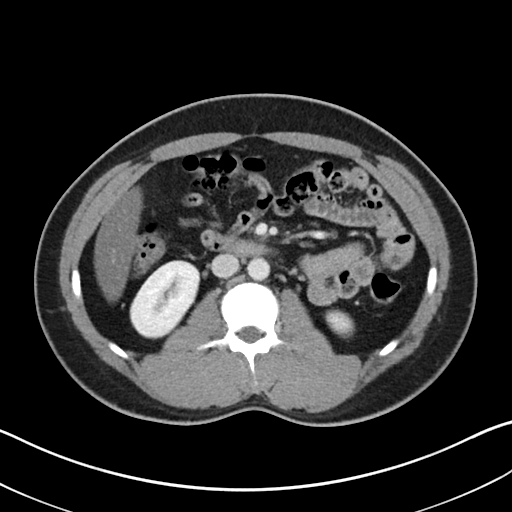
[im 53/87  bone]
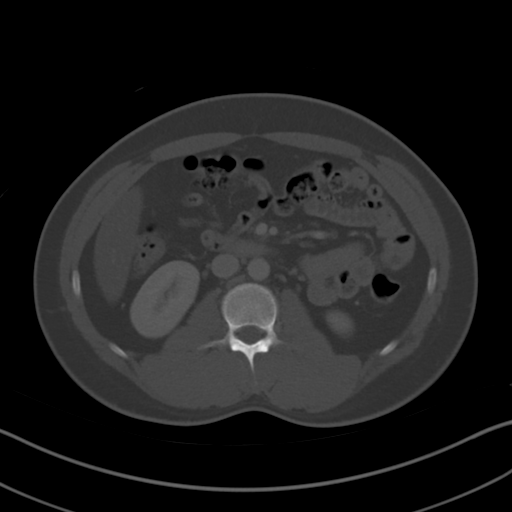
[im 57/87  soft-tissue]
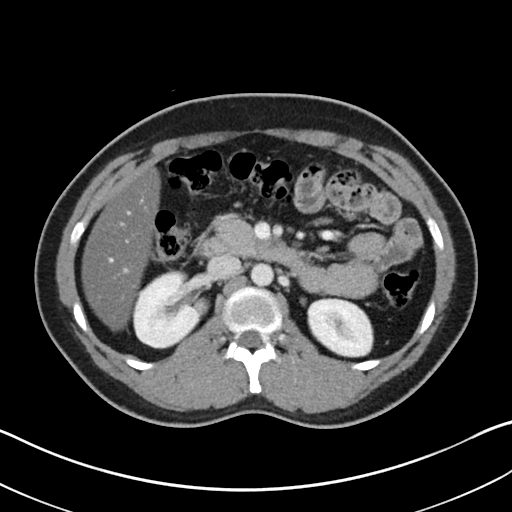
[im 63/87  soft-tissue]
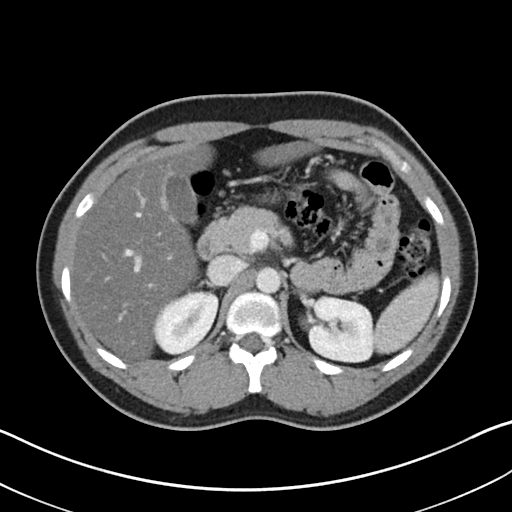
[im 70/87  soft-tissue]
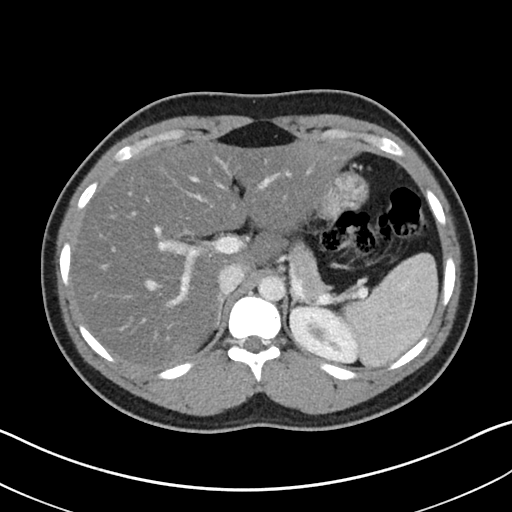
[im 77/87  soft-tissue]
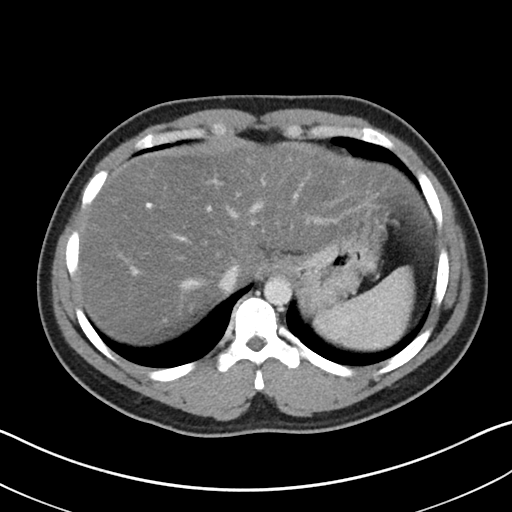
[im 83/87  soft-tissue]
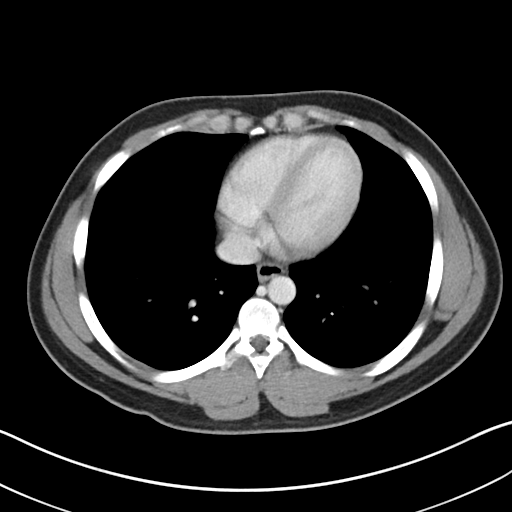

[Series 5: coronal soft tissue · coronal · 0.78mm/px · 3 of 100 slices shown]
[im 34/100  soft-tissue]
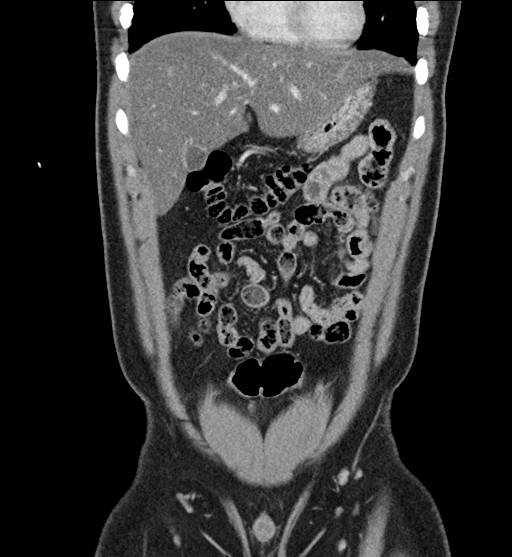
[im 45/100  soft-tissue]
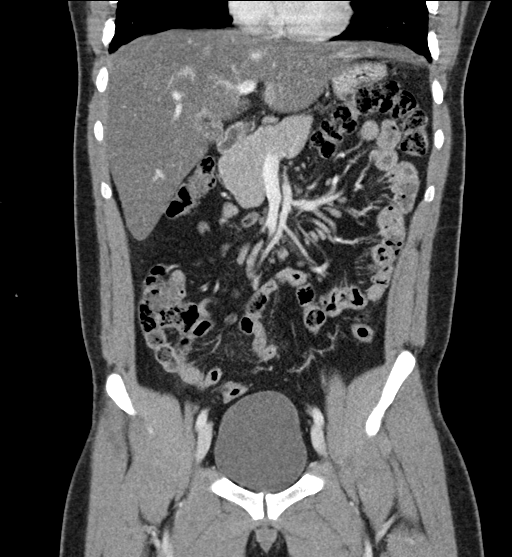
[im 56/100  soft-tissue]
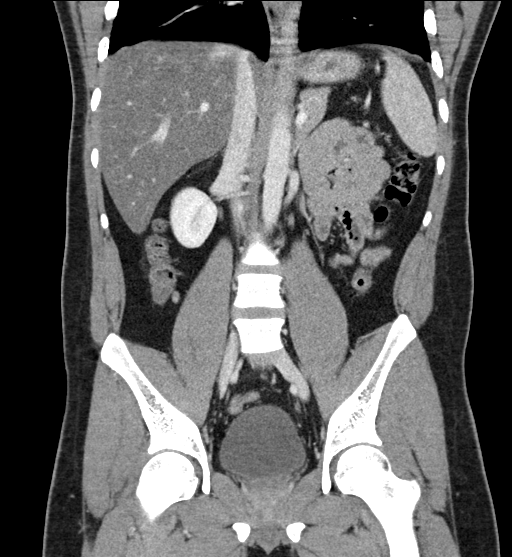

[17 of 46 positions shown; findings below may reference images not displayed]

FINDINGS: Lower chest: No acute abnormality.

Hepatobiliary: Diffuse low density of the liver is identified
without focal liver lesion. The gallbladder is normal. The biliary
tree is normal.

Pancreas: Unremarkable. No pancreatic ductal dilatation or
surrounding inflammatory changes.

Spleen: Normal in size without focal abnormality.

Adrenals/Urinary Tract: Adrenal glands are unremarkable. Kidneys are
normal, without renal calculi, focal lesion, or hydronephrosis.
Bladder is unremarkable.

Stomach/Bowel: Stomach is within normal limits. The appendix is
normal. No evidence of bowel wall thickening, distention, or
inflammatory changes.

Vascular/Lymphatic: No significant vascular findings are present. No
enlarged abdominal or pelvic lymph nodes.

Reproductive: Prostate is unremarkable.

Other: None

Musculoskeletal: No acute abnormality
IMPRESSION: No acute abnormality identified in the abdomen and pelvis. No bowel
obstruction is noted. Normal appendix.

Fatty infiltration of liver.
